# Patient Record
Sex: Female | Born: 1970 | Race: White | Hispanic: No | Smoking: Current some day smoker
Health system: Southern US, Community
[De-identification: ages and names within clinical notes are randomized; demographics above are authoritative.]

## PROBLEM LIST (undated history)

## (undated) DIAGNOSIS — F10920 Alcohol use, unspecified with intoxication, uncomplicated: Secondary | ICD-10-CM

---

## 1997-09-28 DIAGNOSIS — F319 Bipolar disorder, unspecified: Secondary | ICD-10-CM

## 1997-09-28 HISTORY — DX: Bipolar disorder, unspecified: F31.9

## 2018-05-16 ENCOUNTER — Ambulatory Visit: Admitting: Contractor

## 2018-05-16 LAB — HX POINT OF CARE: HX GLUCOSE-POCT: 88 mg/dL (ref 70–139)
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
## 2018-05-16 NOTE — ED Provider Notes (Signed)
Marland Kitchen  Name: [name] [name] [name] [name]  MRN: [id number]  Age: 47 yrs  Sex: Female  DOB: 1971-01-30  Arrival Date: 05/16/2018  Arrival Time: 00:30  Account#: [id number]  .  Working Diagnosis: Alcohol abuse with intoxication  PCP:  .  HPI:  08/23  71:40 47 year old female BIBA for alcohol intoxication after found    apc1        sleeping at a bar. Patient arrives awake, but lethargic. Strong        odor of etoh on breath. Unable to participate in interview..  .  Historical:  - Allergies: No known drug Allergies;  - Home Meds: Unable to Obtain; Unable to Obtain;  - PMHx: None;  - The history from nurses notes was reviewed: and I agree with    what is documented.  .  .  ROS:  06:05 Unable to obtain Review of Systems due to altered mental        apc1        status.  .  Vital Signs:  00:00 BP 128 / 80; Pulse 97; Resp 20; Temp 35.7(TE); Pulse Ox 96% on  dc        R/A;  01:58 BP 120 / 76; Pulse 96; Resp 20; Pulse Ox 97% on R/A;            dc  04:43 BP 118 / 82; Pulse 90; Resp 20; Pulse Ox 97% on R/A;            dc  05:30 BP 112 / 80; Pulse 92; Resp 20; Pulse Ox 97% ;                  dc  .  Glasgow Coma Score:  01:51 Eye Response: to voice(3). Verbal Response: confused(4). Motor  dc        Response: obeys commands(6). Total: 13.  01:57 Eye Response: spontaneous(4). Verbal Response: confused(4).     dc        Motor Response: obeys commands(6). Total: 14.  04:43 Eye Response: to voice(3). Verbal Response: oriented(5). Motor  dc        Response: obeys commands(6). Total: 14.  04:43 Eye Response: to voice(3). Verbal Response: oriented(5). Motor  dc        Response: obeys commands(6). Total: 14.  06:05 Eye Response: spontaneous(4). Verbal Response:                  apc1        incomprehensible(2). Motor Response: localizes pain(5). Total:        11.  06:59 Eye Response: spontaneous(4). Verbal Response: oriented(5).     dc        Motor Response: obeys commands(6). Total: 15.  .  Exam:  06:05 Constitutional: The patient appears well nourished, well         apc1  .  Name:[name] [name]  [id number]  [id number]  Page 1 of 3  %%PAGE  .  Name: [name] [name] [name] [name]  MRN: [id number]  Age: 46 yrs  Sex: Female  DOB: Jul 12, 1971  Arrival Date: 05/16/2018  Arrival Time: 00:30  Account#: [id number]  .  Working Diagnosis: Alcohol abuse with intoxication  PCP:  .        developed, awake, lethargic, non-toxic.  06:05 Head/face: normocephalic, atraumatic.  06:05 Eyes: Pupils: equal, round, and reactive to light. Extraocular        movements: intact throughout, Conjunctiva: injected,        bilaterally.  06:05 ENT:  Posterior pharynx: is normal, airway is patent.  06:05 Neck: ROM/movement: is normal, is supple.  06:05 Respiratory: the patient does not display signs of respiratory        distress, CTAB, no wheezing, rales, or rhonchi.  06:05 Cardiovascular: Rate: normal, Rhythm: regular, Heart sounds:        normal, normal S1and S2.  06:05 Abdomen/GI: soft, non-tender, non-distended.  06:05 Back: ROM is normal.  06:05 Musculoskeletal/extremity: normal full rom.  06:05 Neuro: Orientation: unable to test, the patient is clinically        intoxicated, Motor: moves all fours.  06:05 Skin: warm and dry.  .  MDM:  06:05 ED course: 47 year old female BIBA for alcohol intoxication. Marland Kitchen  apc1  .  08/19  01:05 Order name: Fall Precautions; Complete Time: 01:23              apc1  08/19  01:05 Order name: NPO (Until After Dysphagia Screen); Complete Time:  apc1        01:23  08/19  01:05 Order name: Neuro Checks q 2 hours; Complete Time: 01:23        apc1  08/19  01:05 Order name: Pulse Oximetry Continuous; Complete Time: 01:23     apc1  08/19  01:05 Order name: Refer to ED-OBS; Complete Time: 01:23               apc1  08/19  01:05 Order name: Vital signs q 2 hours; Complete Time: 01:23         apc1  08/19  01:05 Order name: FS gluc; Complete Time: 03:11                       apc1  .  Disposition Summary:  05/16/18 07:03  Discharge Ordered        Location: Home -                                                 apc1        Problem: new(05/16/18 07:03)                                    apc1  .  Name:[name] [name]  [id number]  [id number]  Page 2 of 3  %%PAGE  .  Name: [name] [name] [name] [name]  MRN: [id number]  Age: 80 yrs  Sex: Female  DOB: 1971/04/20  Arrival Date: 05/16/2018  Arrival Time: 00:30  Account#: [id number]  .  Working Diagnosis: Alcohol abuse with intoxication  PCP:  .        Symptoms: have improved(05/16/18 07:03)                         apc1        Condition: Stable(05/16/18 07:03)                               apc1        Diagnosis          - Alcohol abuse with intoxication  apc1        Followup:                                                       apc1          - With: Private Physician          - When: As needed          - Reason: Continuance of care        Discharge Instructions:          - Discharge Summary Sheet                                     apc1          - ALCOHOL ABUSE                                               apc1        Forms:          - Medication Reconciliation Form                              apc1  Signatures:  Trilby Drummer                            RN   Bea Graff                           MD   ss27  Jolayne Panther                           RN   rk10  Donnamarie Poag                           RN   lr15  Seymour Bars                          PA-C apc1  .  Corrections: (The following items were deleted from the chart)  01:22 01:07 apc1                                                      lr15  07:03 01:07 Observation apc1                                          apc1  07:03 01:07 Shroff, Sunil apc1  apc1  07:03 01:07 ED Obsv apc1                                              apc1  07:03 01:07 Stable apc1                                               apc1  07:03 01:07 new apc1                                                  apc1  07:03 01:07 are unchanged apc1                                        apc1  07:03  01:07 Regular apc1                                              apc1  07:03 01:07 Altered mental status, unspecified apc1                   apc1  07:03 01:22 ED Obs lr15                                               apc1  .  Document is preliminary until electronically or manually signed by the atte  nding physician  .  .  .  .  .  .  .  .  .  Marland Kitchen  Name:[name] [name]  [id number]  [id number]  Page 3 of 3  .  %%END

## 2019-08-29 ENCOUNTER — Emergency Department (HOSPITAL_COMMUNITY): Payer: Self-pay

## 2019-08-29 ENCOUNTER — Other Ambulatory Visit: Payer: Self-pay

## 2019-08-29 ENCOUNTER — Encounter (HOSPITAL_COMMUNITY): Payer: Self-pay | Admitting: Emergency Medicine

## 2019-08-29 ENCOUNTER — Emergency Department (HOSPITAL_COMMUNITY)
Admission: EM | Admit: 2019-08-29 | Discharge: 2019-08-29 | Disposition: A | Discharge status: Home / Self Care | Payer: Self-pay | Attending: Emergency Medicine | Admitting: Emergency Medicine

## 2019-08-29 DIAGNOSIS — Y929 Unspecified place or not applicable: Secondary | ICD-10-CM | POA: Insufficient documentation

## 2019-08-29 DIAGNOSIS — Y9389 Activity, other specified: Secondary | ICD-10-CM | POA: Insufficient documentation

## 2019-08-29 DIAGNOSIS — X500XXA Overexertion from strenuous movement or load, initial encounter: Secondary | ICD-10-CM | POA: Insufficient documentation

## 2019-08-29 DIAGNOSIS — Y999 Unspecified external cause status: Secondary | ICD-10-CM | POA: Insufficient documentation

## 2019-08-29 DIAGNOSIS — S22080A Wedge compression fracture of T11-T12 vertebra, initial encounter for closed fracture: Secondary | ICD-10-CM | POA: Insufficient documentation

## 2019-08-29 DIAGNOSIS — F17200 Nicotine dependence, unspecified, uncomplicated: Secondary | ICD-10-CM | POA: Insufficient documentation

## 2019-08-29 MED ORDER — METHOCARBAMOL 500 MG PO TABS: 500.0000 mg | ORAL_TABLET | Freq: Two times a day (BID) | ORAL | 0 refills | Status: DC

## 2019-08-29 MED ORDER — HYDROMORPHONE HCL 1 MG/ML IJ SOLN
0.5000 mg | Freq: Once | INTRAMUSCULAR | Status: AC
  Administered 2019-08-29: 0.5 mg via INTRAVENOUS
  Filled 2019-08-29: qty 1

## 2019-08-29 MED ORDER — MORPHINE SULFATE (PF) 4 MG/ML IV SOLN
4.0000 mg | Freq: Once | INTRAVENOUS | Status: AC
  Administered 2019-08-29: 4 mg via INTRAVENOUS
  Filled 2019-08-29: qty 1

## 2019-08-29 MED ORDER — OXYCODONE-ACETAMINOPHEN 5-325 MG PO TABS: 1.0000 | ORAL_TABLET | ORAL | 0 refills | Status: AC | PRN

## 2019-08-29 MED ORDER — METHOCARBAMOL 500 MG PO TABS
500.0000 mg | ORAL_TABLET | Freq: Once | ORAL | Status: AC
  Administered 2019-08-29: 500 mg via ORAL
  Filled 2019-08-29: qty 1

## 2019-08-29 MED ORDER — ACETAMINOPHEN 500 MG PO TABS
1000.0000 mg | ORAL_TABLET | Freq: Once | ORAL | Status: AC
  Administered 2019-08-29: 1000 mg via ORAL
  Filled 2019-08-29: qty 2

## 2019-08-29 NOTE — ED Provider Notes (Signed)
[facility] DEPT Provider Note   CSN: [id number] Arrival date & time: 08/29/19  0441     History   Chief Complaint Chief Complaint  Patient presents with   Back Pain    HPI Courtney Heath is a 48 y.o. female.  Presents to ER with back pain.  States on Thursday she had sudden onset of back pain after trying to haul heavy large luggage up multiple flights of steps.  Thinks she pulled something in her back.  Denies any falls or direct trauma to her back.  Pain is worse in her lower back, worse with movement.  Currently severe, 8 out of 10, sharp, stabbing, radiates to right hip.  No numbness or weakness noted.  Has been able to ambulate albeit with some difficulty due to pain.  Has been taking Tylenol without significant relief.     HPI  History reviewed. No pertinent past medical history.  There are no active problems to display for this patient.   History reviewed. No pertinent surgical history.   OB History   No obstetric history on file.      Home Medications    Prior to Admission medications   Medication Sig Start Date End Date Taking? Authorizing Provider  methocarbamol (ROBAXIN) 500 MG tablet Take 1 tablet (500 mg total) by mouth 2 (two) times daily. 08/29/19   Lucrezia Starch, MD  oxyCODONE-acetaminophen (PERCOCET) 5-325 MG tablet Take 1 tablet by mouth every 4 (four) hours as needed for up to 3 days for severe pain. 08/29/19 09/01/19  Lucrezia Starch, MD    Family History History reviewed. No pertinent family history.  Social History Social History   Tobacco Use   Smoking status: Current Some Day Smoker   Smokeless tobacco: Never Used  Substance Use Topics   Alcohol use: Yes   Drug use: Never     Allergies   Patient has no known allergies.   Review of Systems Review of Systems  Constitutional: Negative for chills and fever.  HENT: Negative for ear pain and sore throat.   Eyes: Negative for pain and visual  disturbance.  Respiratory: Negative for cough and shortness of breath.   Cardiovascular: Negative for chest pain and palpitations.  Gastrointestinal: Negative for abdominal pain and vomiting.  Genitourinary: Negative for dysuria and hematuria.  Musculoskeletal: Positive for arthralgias and back pain.  Skin: Negative for color change and rash.  Neurological: Negative for seizures and syncope.  All other systems reviewed and are negative.    Physical Exam Updated Vital Signs BP 101/71    Pulse (!) 56    Temp 98 F (36.7 C) (Oral)    Resp 16    Ht 5\' 8"  (1.727 m)    Wt 61.2 kg    SpO2 99%    BMI 20.53 kg/m   Physical Exam Vitals signs and nursing note reviewed.  Constitutional:      General: She is not in acute distress.    Appearance: She is well-developed.  HENT:     Head: Normocephalic and atraumatic.  Eyes:     Conjunctiva/sclera: Conjunctivae normal.  Neck:     Musculoskeletal: Neck supple.  Cardiovascular:     Rate and Rhythm: Normal rate and regular rhythm.     Heart sounds: No murmur.  Pulmonary:     Effort: Pulmonary effort is normal. No respiratory distress.     Breath sounds: Normal breath sounds.  Abdominal:     Palpations: Abdomen is soft.  Tenderness: There is no abdominal tenderness.  Musculoskeletal:     Comments: Back: Tenderness to palpation in upper L-spine, no T-spine tenderness, tenderness in right hip, no palpable deformity noted over back, hip  Skin:    General: Skin is warm and dry.     Capillary Refill: Capillary refill takes less than 2 seconds.  Neurological:     General: No focal deficit present.     Mental Status: She is alert and oriented to person, place, and time.     Comments: 5 out of 5 strength in bilateral upper and lower extremities, sensation to light touch intact in bilateral upper and lower extremities      ED Treatments / Results  Labs (all labs ordered are listed, but only abnormal results are displayed) Labs Reviewed -  No data to display  EKG None  Radiology Ct Lumbar Spine Wo Contrast  Result Date: 08/29/2019 CLINICAL DATA:  Heavy lifting injury less than 6 weeks ago. EXAM: CT LUMBAR SPINE WITHOUT CONTRAST TECHNIQUE: Multidetector CT imaging of the lumbar spine was performed without intravenous contrast administration. Multiplanar CT image reconstructions were also generated. COMPARISON:  None. FINDINGS: Segmentation: There are five lumbar type vertebral bodies. The last full intervertebral disc space is labeled L5-S1. Alignment: Normal overall alignment. There is mild anterolisthesis of L5 due to bilateral pars defects. Vertebrae: There is a superior endplate compression fracture of T12 which appears to be acute or subacute. Minimal retropulsion of the posterosuperior aspect of vertebral body but no canal stenosis. Superior endplate depressions at L1-L2 could be old compression fractures or large Schmorl's nodes. Similar findings at L5. The facets are normally aligned.  No facet or lamina fractures. Paraspinal and other soft tissues: No significant paraspinal or retroperitoneal findings. There are advanced atherosclerotic calcifications involving the aorta for the patient's age. Disc levels: T11-12: No disc protrusions. Minimal retropulsion of the superior posterior aspect of T12 but no spinal or foraminal stenosis. T12-L1: No significant findings. L1-2: No significant findings. L2-3: No significant findings. L3-4: No significant findings. L4-5: No significant findings. L5-S1: Bilateral pars defects with grade 1 spondylolisthesis. Associated advanced degenerative disc disease and facet disease at this level with discogenic sclerosis. There is a bulging uncovered disc but the spinal canal is widened and there is no significant spinal or lateral recess stenosis. There is mild left foraminal encroachment due to spurring changes. IMPRESSION: 1. Acute or subacute superior endplate compression fracture of T12 without  significant retropulsion or canal compromise. 2. Remote superior endplate compression deformities or large Schmorl's nodes involving L1, L2 and L5. 3. 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 4. Mild left foraminal encroachment at L5-S1 due to spurring changes. Electronically Signed   By: Rudie MeyerP.  Gallerani M.D.   On: 08/29/2019 08:59   Dg Hip Unilat W Or Wo Pelvis 2-3 Views Right  Result Date: 08/29/2019 CLINICAL DATA:  Right hip pain after lifting EXAM: DG HIP (WITH OR WITHOUT PELVIS) 2-3V RIGHT COMPARISON:  None. FINDINGS: Anatomic alignment is maintained at the right hip. There is no acute fracture. Joint space is preserved. There is no intrinsic osseous lesion. IMPRESSION: No acute osseous abnormality. Electronically Signed   By: Guadlupe SpanishPraneil  Patel M.D.   On: 08/29/2019 08:31    Procedures Procedures (including critical care time)  Medications Ordered in ED Medications  morphine 4 MG/ML injection 4 mg (4 mg Intravenous Given 08/29/19 0751)  acetaminophen (TYLENOL) tablet 1,000  mg (1,000 mg Oral Given 08/29/19 0746)  methocarbamol (ROBAXIN) tablet 500 mg (500 mg Oral Given 08/29/19 0921)  HYDROmorphone (DILAUDID) injection 0.5 mg (0.5 mg Intravenous Given 08/29/19 6270)     Initial Impression / Assessment and Plan / ED Course  I have reviewed the triage vital signs and the nursing notes.  Pertinent labs & imaging results that were available during my care of the patient were reviewed by me and considered in my medical decision making (see chart for details).  Clinical Course as of Aug 29 1247  Tue Aug 29, 2019  1009 Recheck patient, pain significantly improved, updated on results, will discharge home   [RD]    Clinical Course User Index [RD] Milagros Loll, MD       48 year old lady presents to ER with acute onset of low back pain after heavy lifting incident.  On exam patient  initially somewhat uncomfortable due to pain but in no distress.  She has a normal neurologic exam.  No neuro symptoms.  CT scan ordered to evaluate for any acute traumatic process.  Demonstrated small T12 endplate fracture, no retropulsion, no canal compromise.  Pain well controlled.  Believe she is appropriate for discharge and outpatient management at this time.  Recommended follow-up with neurosurgery or PCP.  Reviewed return precautions.    After the discussed management above, the patient was determined to be safe for discharge.  The patient was in agreement with this plan and all questions regarding their care were answered.  ED return precautions were discussed and the patient will return to the ED with any significant worsening of condition.    Final Clinical Impressions(s) / ED Diagnoses   Final diagnoses:  Compression fracture of T12 vertebra, initial encounter [facility])    ED Discharge Orders         Ordered    oxyCODONE-acetaminophen (PERCOCET) 5-325 MG tablet  Every 4 hours PRN     08/29/19 1021    methocarbamol (ROBAXIN) 500 MG tablet  2 times daily     08/29/19 1021           Milagros Loll, MD 08/29/19 1248

## 2019-08-29 NOTE — ED Triage Notes (Signed)
Pt reports that after returning from her holiday trip she injuried her back lifting her luggage. Currently unable to stand or care for self

## 2019-08-29 NOTE — Discharge Instructions (Addendum)
Recommend taking Tylenol, Motrin and the prescribed muscle relaxer for pain control.  For breakthrough pain, please take the prescribed hydrocodone.  Please note that the muscle relaxer and the hydrocodone can make you drowsy and should not be taken while driving or operating heavy machinery.  Recommend that you follow-up either with your primary care doctor or with neurosurgery.  The CT scan showed a T12 endplate compression fracture.  Recommend refraining from any heavy lifting or strenuous exercise until cleared by your primary doctor or neurosurgery.  If you develop worsening severe pain, numbness, weakness or other new concerning symptom recommend return to ER for reassessment.

## 2019-09-02 ENCOUNTER — Other Ambulatory Visit: Payer: Self-pay

## 2019-09-02 ENCOUNTER — Encounter (HOSPITAL_COMMUNITY): Payer: Self-pay

## 2019-09-02 ENCOUNTER — Emergency Department (HOSPITAL_COMMUNITY)
Admission: EM | Admit: 2019-09-02 | Discharge: 2019-09-02 | Disposition: A | Discharge status: Home / Self Care | Payer: Self-pay | Attending: Emergency Medicine | Admitting: Emergency Medicine

## 2019-09-02 ENCOUNTER — Emergency Department (HOSPITAL_COMMUNITY): Payer: Self-pay

## 2019-09-02 DIAGNOSIS — M545 Low back pain, unspecified: Secondary | ICD-10-CM

## 2019-09-02 DIAGNOSIS — F17200 Nicotine dependence, unspecified, uncomplicated: Secondary | ICD-10-CM | POA: Insufficient documentation

## 2019-09-02 DIAGNOSIS — M546 Pain in thoracic spine: Secondary | ICD-10-CM | POA: Insufficient documentation

## 2019-09-02 MED ORDER — METHOCARBAMOL 500 MG PO TABS: 500.0000 mg | ORAL_TABLET | Freq: Two times a day (BID) | ORAL | 0 refills | Status: AC | PRN

## 2019-09-02 MED ORDER — LIDOCAINE 5 % EX PTCH: 1.0000 | MEDICATED_PATCH | CUTANEOUS | 0 refills | Status: AC

## 2019-09-02 MED ORDER — NAPROXEN 500 MG PO TABS: 500.0000 mg | ORAL_TABLET | Freq: Two times a day (BID) | ORAL | 0 refills | Status: AC

## 2019-09-02 MED ORDER — NAPROXEN 500 MG PO TABS
500.0000 mg | ORAL_TABLET | Freq: Once | ORAL | Status: AC
  Administered 2019-09-02: 500 mg via ORAL
  Filled 2019-09-02: qty 1

## 2019-09-02 NOTE — ED Notes (Signed)
Pt left without DC paperwork. Stable condition ambulatory out of ED
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
## 2019-09-02 NOTE — ED Notes (Signed)
PT transported to Xray

## 2019-09-02 NOTE — Discharge Instructions (Addendum)
You were seen in the emergency department today for back pain.  Your x-rays did not show any new abnormalities.  It did show degenerative changes and some narrowing throughout your spine as well as your compression fracture which was noted at your visit a few days ago.  Please follow-up with neurosurgery and your primary care provider as previously discussed. WE are sending home with the following medicines:  - Naproxen is a nonsteroidal anti-inflammatory medication that will help with pain and swelling. Be sure to take this medication as prescribed with food, 1 pill every 12 hours,  It should be taken with food, as it can cause stomach upset, and more seriously, stomach bleeding. Do not take other nonsteroidal anti-inflammatory medications with this such as Advil, Motrin, Aleve, Mobic, Goodie Powder, or Motrin.    - Robaxin is the muscle relaxer I have prescribed, this is meant to help with muscle tightness. Be aware that this medication may make you drowsy therefore the first time you take this it should be at a time you are in an environment where you can rest. Do not drive or operate heavy machinery when taking this medication. Do not drink alcohol or take other sedating medications with this medicine such as narcotics or benzodiazepines.   -Lidoderm patches: This is a patch to place directly over your low significant area of pain once per day.  This patch did help to numb/to the area.  You make take Tylenol per over the counter dosing with these medications.   We have prescribed you new medication(s) today. Discuss the medications prescribed today with your pharmacist as they can have adverse effects and interactions with your other medicines including over the counter and prescribed medications. Seek medical evaluation if you start to experience new or abnormal symptoms after taking one of these medicines, seek care immediately if you start to experience difficulty breathing, feeling of your throat  closing, facial swelling, or rash as these could be indications of a more serious allergic reaction  Please follow-up with neurosurgery or with primary care within 3 days.  Return to the ER for new or worsening symptoms including but not limited to numbness, weakness, trouble walking, loss of control bowel or bladder function, fever, passing out, or any other concerns.

## 2019-09-02 NOTE — ED Notes (Signed)
Pt walking out of the room and standing in the hallway. Pt told repeatedly that she is not allowed to just stand in the hallway and asked multiple times to keep her mask on. Pt stated "I have been up since 3:30 this morning, something has got to give." Pt went back to room and came out again, walking towards the door." PT was asked if she was leaving and raised her voice to this writer saying she has got to be up at 3:30 and she has to go." Pt refused discharge vital signs and ambulate to the lobby with a steady gait in no apparent distress. Corey Skains, RN notified.
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
## 2019-09-03 ENCOUNTER — Encounter (HOSPITAL_COMMUNITY): Payer: Self-pay | Admitting: Emergency Medicine

## 2019-09-03 ENCOUNTER — Other Ambulatory Visit: Payer: Self-pay

## 2019-09-03 ENCOUNTER — Emergency Department (HOSPITAL_COMMUNITY)
Admission: EM | Admit: 2019-09-03 | Discharge: 2019-09-03 | Disposition: A | Discharge status: Home / Self Care | Payer: Self-pay | Attending: Emergency Medicine | Admitting: Emergency Medicine

## 2019-09-03 DIAGNOSIS — R461 Bizarre personal appearance: Secondary | ICD-10-CM | POA: Insufficient documentation

## 2019-09-03 DIAGNOSIS — Z5321 Procedure and treatment not carried out due to patient leaving prior to being seen by health care provider: Secondary | ICD-10-CM | POA: Insufficient documentation
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
## 2019-09-03 NOTE — ED Notes (Signed)
Pt's vital signs were checked. Pt stated that she does not want to be seen and just wants to go home. Pt was A&O x4 at this time. Pt was brought to consult room and this Probation officer contacted [employer] an requested service to [address] [place]. When taxi arrived, this Probation officer escorted pt to taxi. Pt said she had money to pay for the taxi.

## 2019-12-07 ENCOUNTER — Emergency Department: Payer: 344

## 2019-12-07 ENCOUNTER — Emergency Department
Admission: EM | Admit: 2019-12-07 | Discharge: 2019-12-07 | Disposition: A | Discharge status: Home / Self Care | Payer: 344 | Attending: Emergency Medicine | Admitting: Emergency Medicine

## 2019-12-07 DIAGNOSIS — S01511A Laceration without foreign body of lip, initial encounter: Secondary | ICD-10-CM | POA: Insufficient documentation

## 2019-12-07 DIAGNOSIS — F10920 Alcohol use, unspecified with intoxication, uncomplicated: Secondary | ICD-10-CM

## 2019-12-07 DIAGNOSIS — F10120 Alcohol abuse with intoxication, uncomplicated: Secondary | ICD-10-CM | POA: Insufficient documentation

## 2019-12-07 DIAGNOSIS — W500XXA Accidental hit or strike by another person, initial encounter: Secondary | ICD-10-CM | POA: Insufficient documentation

## 2019-12-07 DIAGNOSIS — R079 Chest pain, unspecified: Secondary | ICD-10-CM

## 2019-12-07 DIAGNOSIS — Z20822 Contact with and (suspected) exposure to covid-19: Secondary | ICD-10-CM | POA: Insufficient documentation

## 2019-12-07 HISTORY — DX: Alcohol use, unspecified with intoxication, uncomplicated: F10.920

## 2019-12-07 LAB — MAGNESIUM: Magnesium: 2.1 mg/dL (ref 1.6–2.6)

## 2019-12-07 LAB — ECG 12-LEAD
Atrial Rate: 86 {beats}/min
P Axis: 19 degrees
P-R Interval: 140 ms
Q-T Interval: 368 ms
QRS Duration: 88 ms
QTC Calculation (Bezet): 440 ms
R Axis: 65 degrees
T Axis: -11 degrees
Ventricular Rate: 86 {beats}/min

## 2019-12-07 LAB — CBC AND DIFFERENTIAL
Absolute NRBC: 0 10*3/uL (ref 0.00–0.00)
Basophils Absolute Automated: 0.04 10*3/uL (ref 0.00–0.08)
Basophils Automated: 0.4 %
Eosinophils Absolute Automated: 0.03 10*3/uL (ref 0.00–0.44)
Eosinophils Automated: 0.3 %
Hematocrit: 50.3 % — ABNORMAL HIGH (ref 34.7–43.7)
Hgb: 16.2 g/dL — ABNORMAL HIGH (ref 11.4–14.8)
Immature Granulocytes Absolute: 0.05 10*3/uL (ref 0.00–0.07)
Immature Granulocytes: 0.6 %
Lymphocytes Absolute Automated: 1.98 10*3/uL (ref 0.42–3.22)
Lymphocytes Automated: 21.8 %
MCH: 30.7 pg (ref 25.1–33.5)
MCHC: 32.2 g/dL (ref 31.5–35.8)
MCV: 95.4 fL (ref 78.0–96.0)
MPV: 9.9 fL (ref 8.9–12.5)
Monocytes Absolute Automated: 0.34 10*3/uL (ref 0.21–0.85)
Monocytes: 3.7 %
Neutrophils Absolute: 6.65 10*3/uL — ABNORMAL HIGH (ref 1.10–6.33)
Neutrophils: 73.2 %
Nucleated RBC: 0 /100 WBC (ref 0.0–0.0)
Platelets: 255 10*3/uL (ref 142–346)
RBC: 5.27 10*6/uL — ABNORMAL HIGH (ref 3.90–5.10)
RDW: 12 % (ref 11–15)
WBC: 9.09 10*3/uL (ref 3.10–9.50)

## 2019-12-07 LAB — COMPREHENSIVE METABOLIC PANEL
ALT: 19 U/L (ref 0–55)
AST (SGOT): 25 U/L (ref 5–34)
Albumin/Globulin Ratio: 1.4 (ref 0.9–2.2)
Albumin: 4.4 g/dL (ref 3.5–5.0)
Alkaline Phosphatase: 98 U/L (ref 37–106)
Anion Gap: 11 (ref 5.0–15.0)
BUN: 10 mg/dL (ref 7.0–19.0)
Bilirubin, Total: 0.2 mg/dL (ref 0.2–1.2)
CO2: 27 mEq/L (ref 22–29)
Calcium: 9.4 mg/dL (ref 8.5–10.5)
Chloride: 110 mEq/L (ref 100–111)
Creatinine: 0.9 mg/dL (ref 0.6–1.0)
Globulin: 3.2 g/dL (ref 2.0–3.6)
Glucose: 83 mg/dL (ref 70–100)
Potassium: 4.2 mEq/L (ref 3.5–5.1)
Protein, Total: 7.6 g/dL (ref 6.0–8.3)
Sodium: 148 mEq/L — ABNORMAL HIGH (ref 136–145)

## 2019-12-07 LAB — SALICYLATE LEVEL: Salicylate Level: <5 mg/dL — ABNORMAL LOW (ref 15.0–30.0)

## 2019-12-07 LAB — TSH: TSH: 0.54 u[IU]/mL (ref 0.35–4.94)

## 2019-12-07 LAB — COVID-19 (SARS-COV-2): SARS CoV 2 Overall Result: NEGATIVE

## 2019-12-07 LAB — GFR: EGFR: >60

## 2019-12-07 LAB — ACETAMINOPHEN LEVEL: Acetaminophen Level: <7 ug/mL — ABNORMAL LOW (ref 10–30)

## 2019-12-07 LAB — ETHANOL: Alcohol: 345 mg/dL — ABNORMAL HIGH

## 2019-12-07 NOTE — ED Notes (Signed)
Pt offered something for her lip that is bleeding, but pt stated "my lip is fine, I just want to talk to my actual doctor". Pt tearful in room at this time, denies any needs. Pt provided with water.

## 2019-12-07 NOTE — Discharge Instructions (Signed)
Alcohol Intoxication    You have been seen for alcohol intoxication. This is another name for being drunk.    Using alcohol as you did today suggests you might have a problem with alcohol abuse.    Alcohol abuse can cause many problems such as liver disease, stomach ulcers and pancreatitis.    Drinking enough alcohol can cause you to stop breathing and die.    Fortunately, you did not suffer any life-threatening complications today.    Do not drive a vehicle if you have been drinking alcohol. You might hurt or kill yourself or someone else if you drive after drinking alcohol.    Return here or go to the nearest Emergency Department immediately if:   You drink enough to lose consciousness or black out.   You become confused, have trouble thinking or are so tired you find it hard to function, even if you havent been drinking alcohol.    If you can't follow up with your doctor, or if at any time you feel you need to be rechecked or seen again, come back here or go to the nearest emergency department.               Laceration, General Wound Care      For the next 24 hours:   Keep the wound clean and dry to help prevent infection.    Keep the injured area elevated (lifted) to decrease swelling and pain   Starting tomorrow:   To help remove a scab, cleanse the area with a mixture of half hydrogen peroxide and half water.    Unless you were told not to do so, you can place a thin layer of antibiotic ointment over the wound. If the causes irritation to your skin, stop using it and switch to another antibiotic ointment. Ask your doctor or pharmacist if you need help choosing an antibiotic ointment.   If needed, put a clean, dry bandage over the wound to protect it.   Do not allow your wound to soak in water. Depending on where your wound is, you may not be able to wash dishes, go swimming, or soak in a hot tub.    You may put an ice pack on the area at least 4 times each day.   Place some ice cubes in a  re-sealable (Ziploc) bag and add some water.   Seal the bag closed.   Put a thin washcloth between the bag and the skin.    Apply the ice bag to the area for at least 20 minutes.    Never apply directly to skin.      Return here or go to the nearest Emergency Department immediately if:   You see redness or swelling.   You see red streaks redness around the wound.   Your wound smells bad.   Your wound has has a lot of drainage.   You have more pain that is not controlled with pain medicine.   You have fever (temperature higher than 100.38F or 38C) or chills.    If you can't follow up with your doctor, or if at any time you feel you need to be rechecked or seen again, come back here or go to the nearest emergency department.

## 2019-12-07 NOTE — ED Notes (Signed)
This RN attempted to identify pt prior to pt leaving. Pt was refusing to tell this RN any information, but then stated her name is Dominique Young. Pt states her birthday is [birth year], but refused to state the year she was born. Pt states she does not want anyone to know she was here, and this RN tried to explain the reasoning behind [place] needing her medical information, but pt still refused to say anything than stated above. Pt then left. After speaking with Pastino, MD, pt had given false information about her name, DOB, and SS# on arrival. Charge RN Trinna Post made aware of situation, and registration notified about the situation as well.

## 2019-12-07 NOTE — ED Notes (Signed)
Pt refused discharge VS.  
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
## 2019-12-07 NOTE — ED Notes (Signed)
Bed: N 40  Expected date:   Expected time:   Means of arrival: [id number] [place]  Comments:  [facility] 413
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
## 2020-09-27 IMAGING — CR DG LUMBAR SPINE COMPLETE 4+V
5 series · 5 of 5 positions shown · non-contrast
Comparison: CT lumbar spine 08/29/2019

CLINICAL DATA: Back pain for over 2 weeks

EXAM:
LUMBAR SPINE - COMPLETE 4+ VIEW

[t lumbar spine ap]
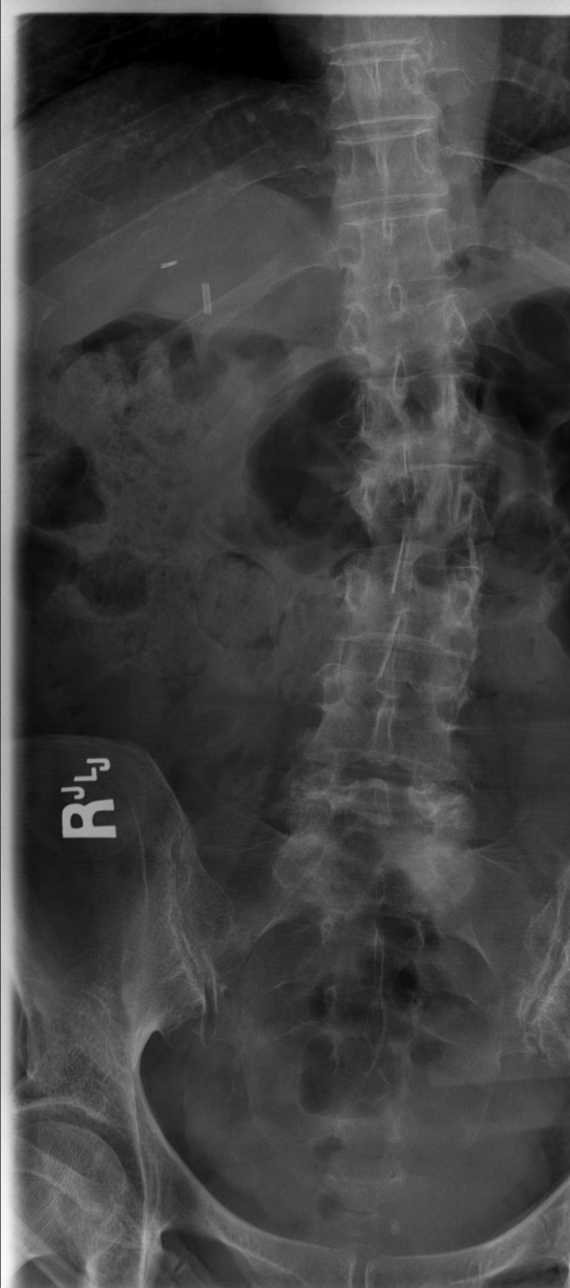

[t lumbar spine obl (1 of 2)]
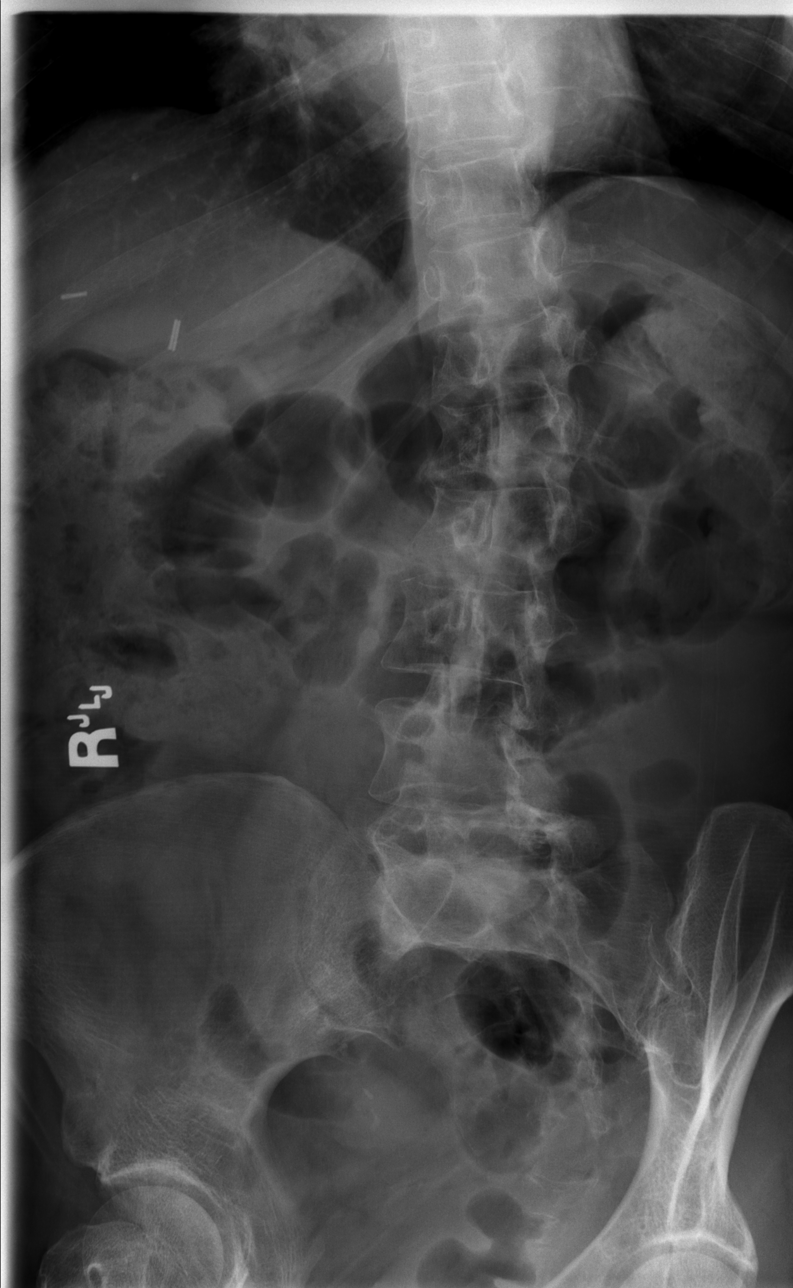

[t lumbar spine obl (2 of 2)]
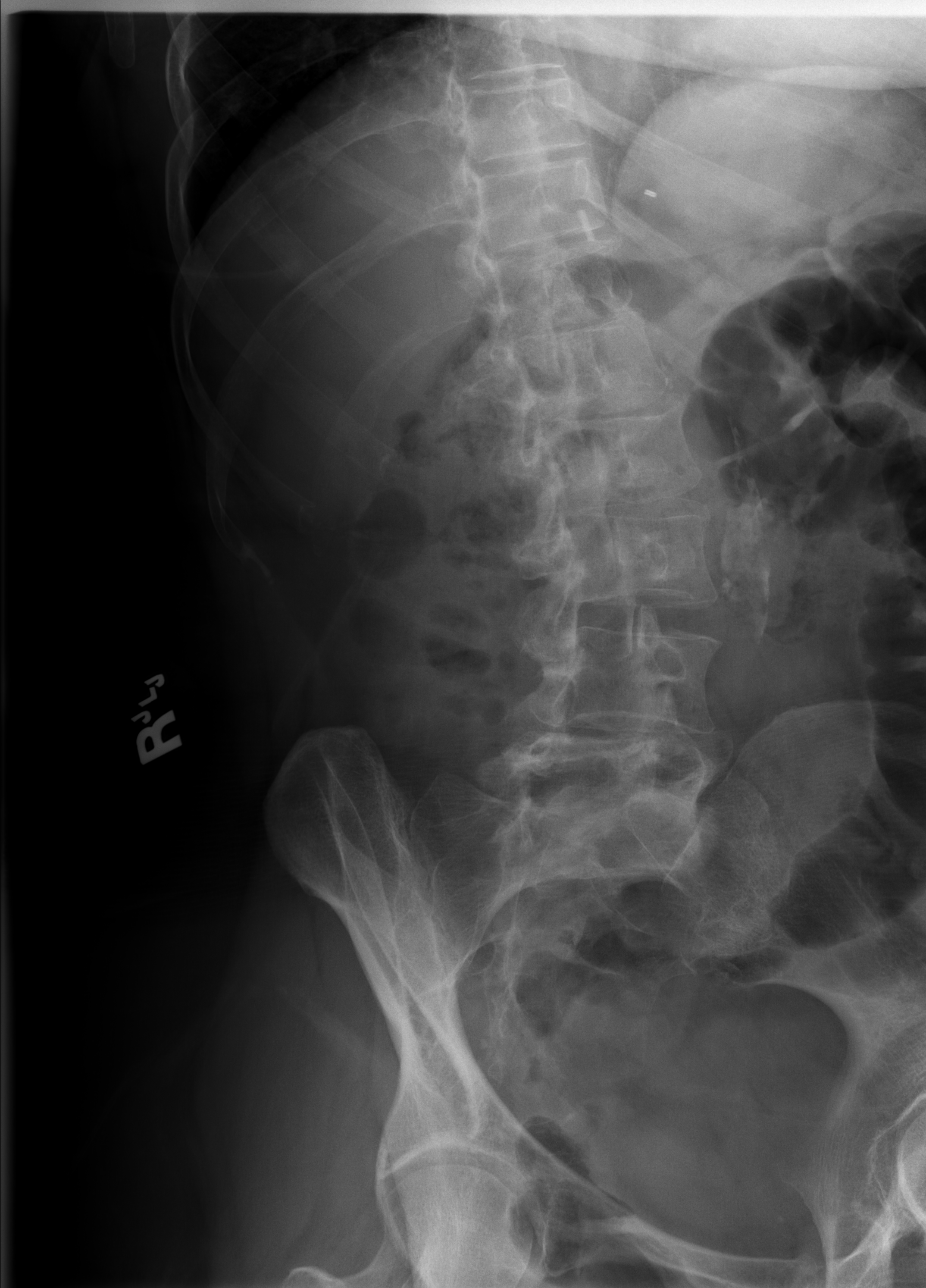

[t lumbar spine lat]
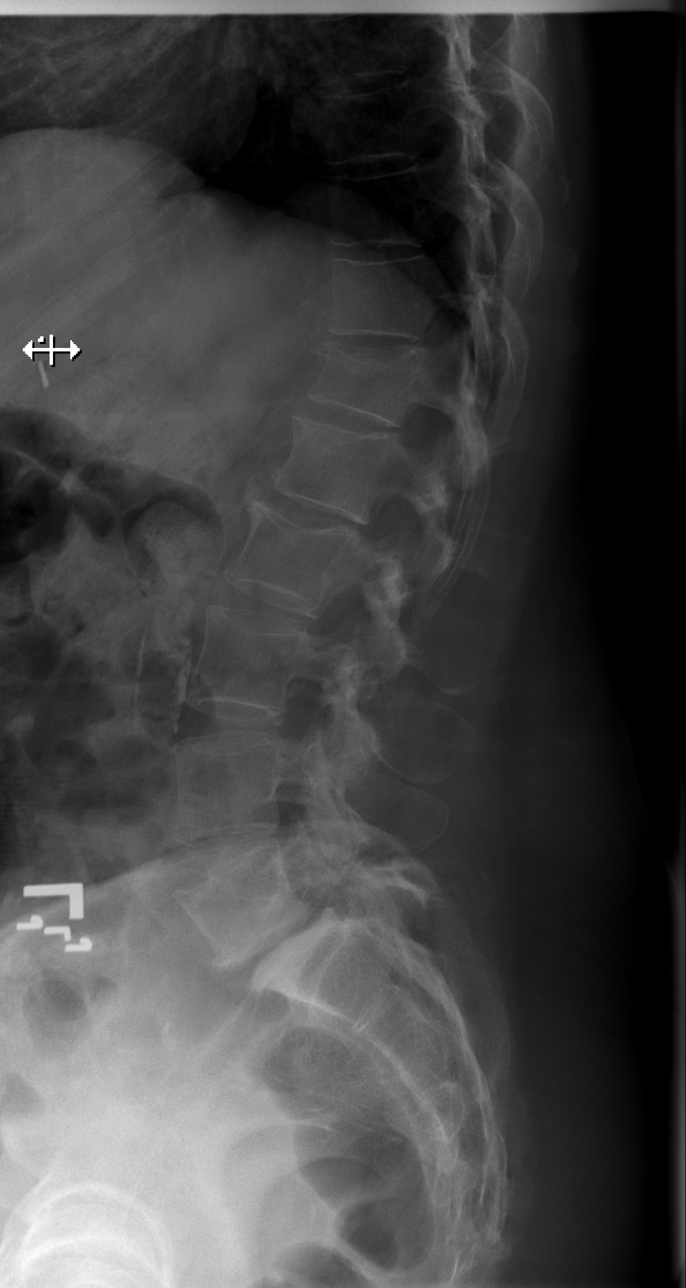

[t lumbar l-5 s-1 spot]
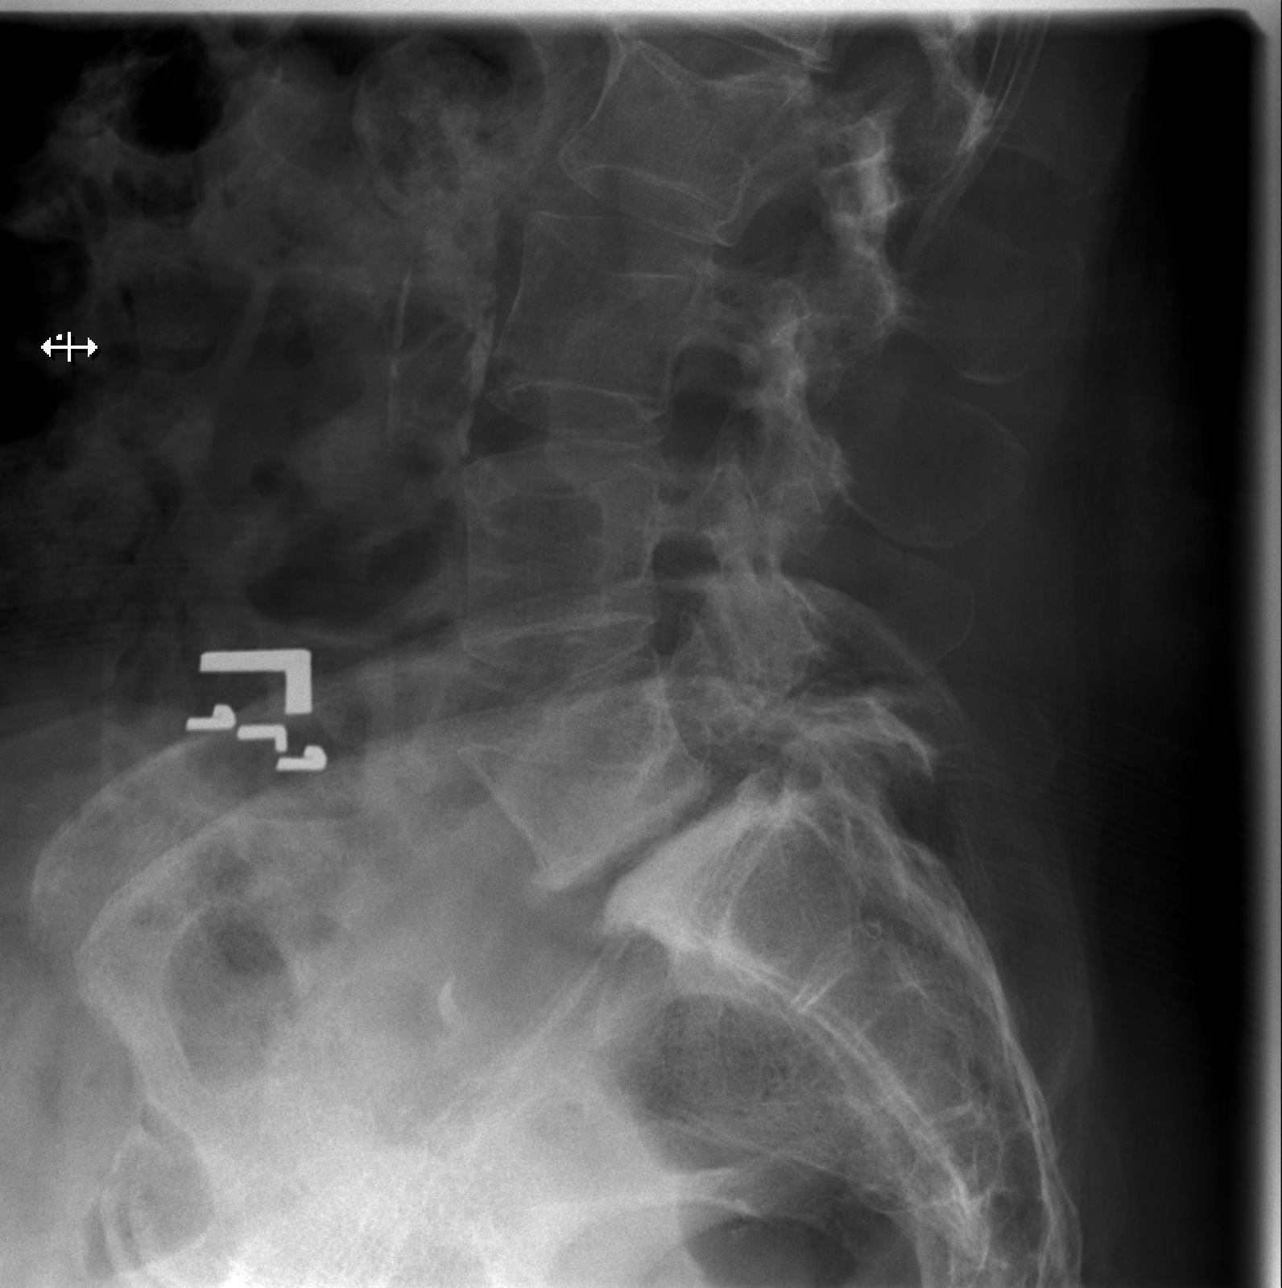

[5 of 5 positions shown; findings below may reference images not displayed]

FINDINGS: Osseous demineralization.

Five non-rib-bearing lumbar vertebra.

Levoconvex thoracolumbar scoliosis.

Grade 1 anterolisthesis L5-S1 secondary to BILATERAL spondylolysis
L5.

Superior endplate compression fractures of T12, L1 and L2 unchanged.

T12 fracture appears acute.

No additional fracture, subluxation or bone destruction.

SI joints preserved.
IMPRESSION: Levoconvex thoracolumbar scoliosis.

Osseous demineralization with superior endplate compression
fractures of T12, L1 and L2.

Grade 1 anterolisthesis L5-S1 secondary to BILATERAL spondylolysis
L5.

No new abnormalities since 08/29/2019.

## 2020-09-27 IMAGING — CR DG THORACIC SPINE 2V
2 series · 2 of 2 positions shown · non-contrast
Comparison: CT of the lumbar spine August 29, 2019

CLINICAL DATA: Back pain.

EXAM:
THORACIC SPINE 2 VIEWS

[t thoracic spine ap]
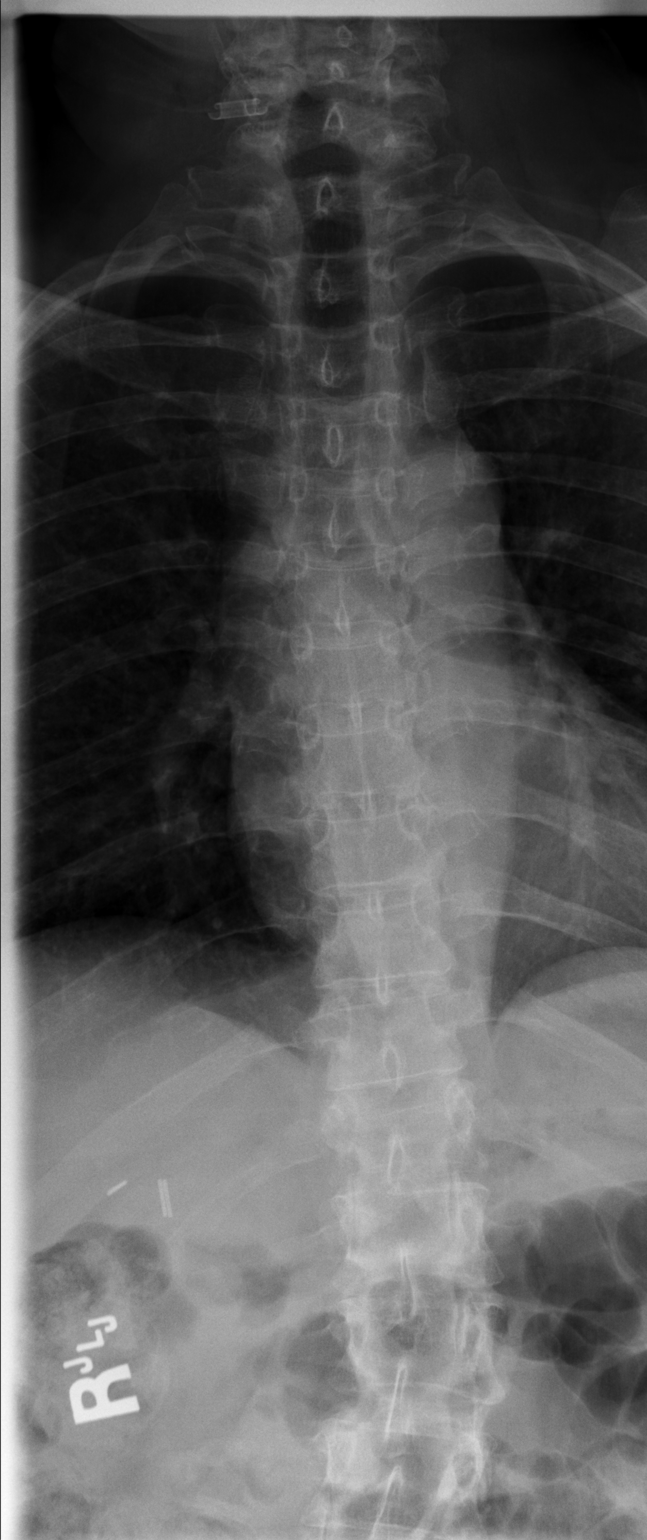

[t thoracic breathing lat]
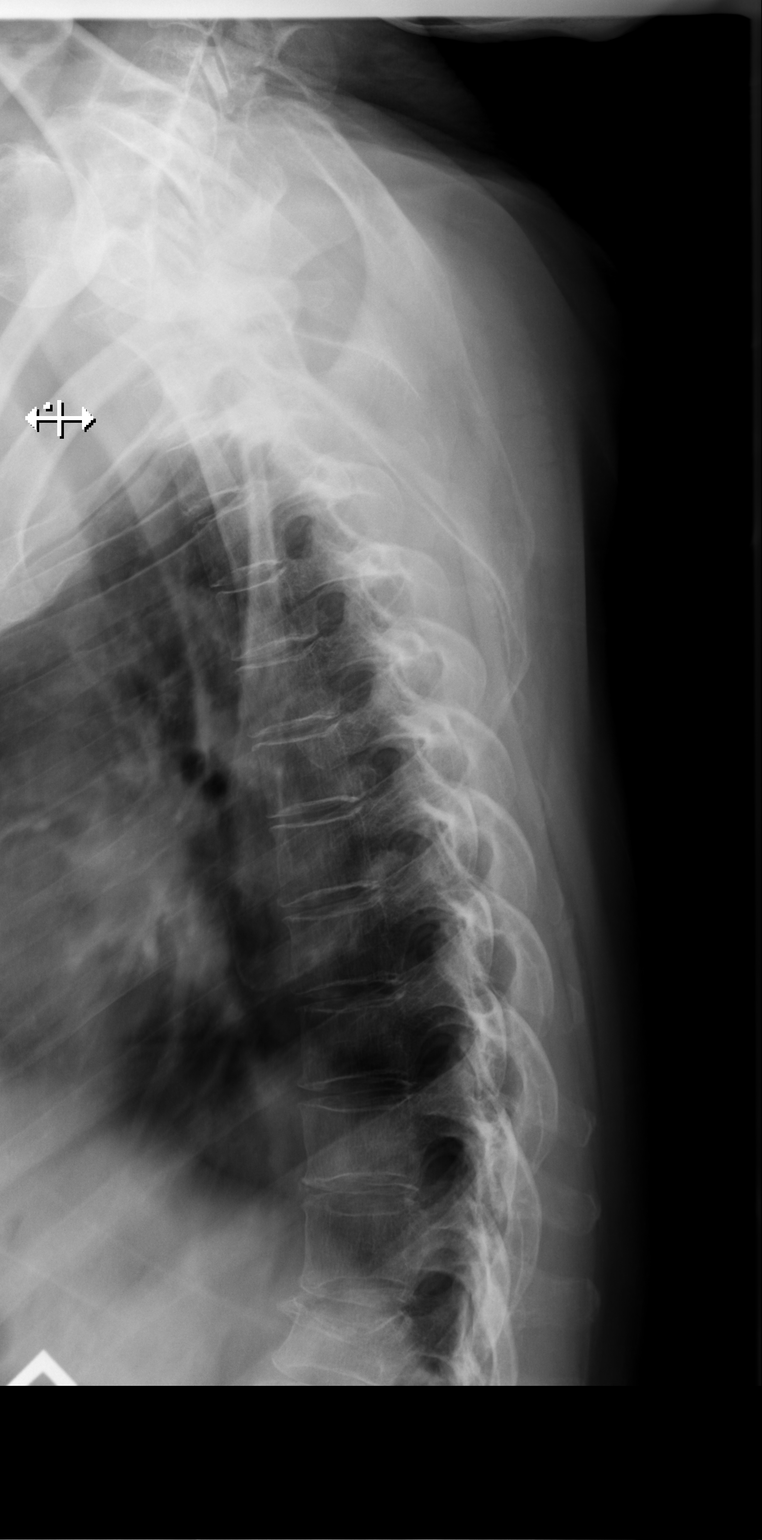

[2 of 2 positions shown; findings below may reference images not displayed]

FINDINGS: Limited views of the chest are normal. No inter pedicular widening
identified. A compression fracture of T12 is similar in appearance
when compared to the CT scan from August 29, 2019.
IMPRESSION: The patient's known T12 compression fracture is similar in
appearance when compared to the CT scan of the lumbar spine from
August 29, 2019. No other fractures are seen in the thoracic spine
are identified.

## 2021-08-15 ENCOUNTER — Emergency Department (HOSPITAL_COMMUNITY)
Admission: EM | Admit: 2021-08-15 | Discharge: 2021-08-16 | Disposition: A | Discharge status: Home / Self Care | Payer: Self-pay | Attending: Emergency Medicine | Admitting: Emergency Medicine

## 2021-08-15 ENCOUNTER — Encounter (HOSPITAL_COMMUNITY): Payer: Self-pay | Admitting: Emergency Medicine

## 2021-08-15 DIAGNOSIS — Y908 Blood alcohol level of 240 mg/100 ml or more: Secondary | ICD-10-CM | POA: Insufficient documentation

## 2021-08-15 DIAGNOSIS — Z79899 Other long term (current) drug therapy: Secondary | ICD-10-CM | POA: Insufficient documentation

## 2021-08-15 DIAGNOSIS — R4189 Other symptoms and signs involving cognitive functions and awareness: Secondary | ICD-10-CM

## 2021-08-15 DIAGNOSIS — F17200 Nicotine dependence, unspecified, uncomplicated: Secondary | ICD-10-CM | POA: Insufficient documentation

## 2021-08-15 DIAGNOSIS — F10121 Alcohol abuse with intoxication delirium: Secondary | ICD-10-CM | POA: Insufficient documentation

## 2021-08-15 DIAGNOSIS — F10921 Alcohol use, unspecified with intoxication delirium: Secondary | ICD-10-CM

## 2021-08-15 LAB — CBC WITH DIFFERENTIAL/PLATELET
Abs Immature Granulocytes: 0.02 10*3/uL (ref 0.00–0.07)
Basophils Absolute: 0 10*3/uL (ref 0.0–0.1)
Basophils Relative: 1 %
Eosinophils Absolute: 0.1 10*3/uL (ref 0.0–0.5)
Eosinophils Relative: 1 %
HCT: 43 % (ref 36.0–46.0)
Hemoglobin: 14.1 g/dL (ref 12.0–15.0)
Immature Granulocytes: 0 %
Lymphocytes Relative: 48 %
Lymphs Abs: 3.4 10*3/uL (ref 0.7–4.0)
MCH: 31.3 pg (ref 26.0–34.0)
MCHC: 32.8 g/dL (ref 30.0–36.0)
MCV: 95.6 fL (ref 80.0–100.0)
Monocytes Absolute: 0.4 10*3/uL (ref 0.1–1.0)
Monocytes Relative: 6 %
Neutro Abs: 3.1 10*3/uL (ref 1.7–7.7)
Neutrophils Relative %: 44 %
Platelets: 192 10*3/uL (ref 150–400)
RBC: 4.5 MIL/uL (ref 3.87–5.11)
RDW: 12.9 % (ref 11.5–15.5)
WBC: 7.1 10*3/uL (ref 4.0–10.5)
nRBC: 0 % (ref 0.0–0.2)

## 2021-08-15 LAB — COMPREHENSIVE METABOLIC PANEL
ALT: 14 U/L (ref 0–44)
AST: 23 U/L (ref 15–41)
Albumin: 3.4 g/dL — ABNORMAL LOW (ref 3.5–5.0)
Alkaline Phosphatase: 70 U/L (ref 38–126)
Anion gap: 10 (ref 5–15)
BUN: 15 mg/dL (ref 6–20)
CO2: 20 mmol/L — ABNORMAL LOW (ref 22–32)
Calcium: 8.2 mg/dL — ABNORMAL LOW (ref 8.9–10.3)
Chloride: 113 mmol/L — ABNORMAL HIGH (ref 98–111)
Creatinine, Ser: 0.89 mg/dL (ref 0.44–1.00)
GFR, Estimated: >60 mL/min (ref >60)
Glucose, Bld: 93 mg/dL (ref 70–99)
Potassium: 4.1 mmol/L (ref 3.5–5.1)
Sodium: 143 mmol/L (ref 135–145)
Total Bilirubin: 0.6 mg/dL (ref 0.3–1.2)
Total Protein: 6 g/dL — ABNORMAL LOW (ref 6.5–8.1)

## 2021-08-15 LAB — ETHANOL: Alcohol, Ethyl (B): 436 mg/dL (ref <10)

## 2021-08-15 NOTE — ED Notes (Addendum)
Patient placed on a monitor , warm blanket applied , respirations unlabored. IV site intact .
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
## 2021-08-16 LAB — RAPID URINE DRUG SCREEN, HOSP PERFORMED
Amphetamines: NOT DETECTED
Barbiturates: NOT DETECTED
Benzodiazepines: POSITIVE — AB
Cocaine: NOT DETECTED
Opiates: NOT DETECTED
Tetrahydrocannabinol: NOT DETECTED

## 2021-08-16 NOTE — ED Notes (Signed)
Dr. Clayborne Dana at bedside speaking with patient .
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
## 2022-08-08 ENCOUNTER — Emergency Department (HOSPITAL_COMMUNITY): Payer: Self-pay

## 2022-08-08 ENCOUNTER — Emergency Department (HOSPITAL_COMMUNITY)
Admission: EM | Admit: 2022-08-08 | Discharge: 2022-08-08 | Discharge status: Against Medical Advice | Payer: Self-pay | Attending: Emergency Medicine | Admitting: Emergency Medicine

## 2022-08-08 ENCOUNTER — Encounter (HOSPITAL_COMMUNITY): Payer: Self-pay

## 2022-08-08 ENCOUNTER — Other Ambulatory Visit: Payer: Self-pay

## 2022-08-08 DIAGNOSIS — F1010 Alcohol abuse, uncomplicated: Secondary | ICD-10-CM

## 2022-08-08 DIAGNOSIS — F10129 Alcohol abuse with intoxication, unspecified: Secondary | ICD-10-CM | POA: Insufficient documentation

## 2022-08-08 DIAGNOSIS — W08XXXA Fall from other furniture, initial encounter: Secondary | ICD-10-CM | POA: Insufficient documentation

## 2022-08-08 DIAGNOSIS — Y9259 Other trade areas as the place of occurrence of the external cause: Secondary | ICD-10-CM | POA: Insufficient documentation

## 2022-08-08 DIAGNOSIS — S0990XA Unspecified injury of head, initial encounter: Secondary | ICD-10-CM | POA: Insufficient documentation
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
